# Patient Record
Sex: Female | Born: 1990
Health system: Southern US, Community
[De-identification: ages and names within clinical notes are randomized; demographics above are authoritative.]

## PROBLEM LIST (undated history)

## (undated) DIAGNOSIS — J45909 Unspecified asthma, uncomplicated: Secondary | ICD-10-CM

## (undated) HISTORY — PX: NO PAST SURGERIES: SHX2092

---

## 2018-12-26 NOTE — L&D Delivery Note (Signed)
Delivery Note At 7:04 PM a viable and healthy female was delivered via Vaginal, Spontaneous (Presentation: LOA ).  APGAR: 8, 9; weight pending.   Placenta status: spontaneous,intact .  Cord:  with the following complications: .  Cord pH: na  Anesthesia: epidural Episiotomy: None Lacerations: Vaginal;Periurethral Suture Repair: 2.0 vicryl rapide Est. Blood Loss (mL): 200  Mom to postpartum.  Baby to Couplet care / Skin to Skin.  Juna Caban J 11/03/2019, 8:12 PM

## 2019-03-18 DIAGNOSIS — Z3201 Encounter for pregnancy test, result positive: Secondary | ICD-10-CM | POA: Diagnosis not present

## 2019-04-22 DIAGNOSIS — Z118 Encounter for screening for other infectious and parasitic diseases: Secondary | ICD-10-CM | POA: Diagnosis not present

## 2019-04-22 DIAGNOSIS — Z124 Encounter for screening for malignant neoplasm of cervix: Secondary | ICD-10-CM | POA: Diagnosis not present

## 2019-04-22 DIAGNOSIS — Z3689 Encounter for other specified antenatal screening: Secondary | ICD-10-CM | POA: Diagnosis not present

## 2019-04-22 DIAGNOSIS — Z3682 Encounter for antenatal screening for nuchal translucency: Secondary | ICD-10-CM | POA: Diagnosis not present

## 2019-04-22 DIAGNOSIS — Z3401 Encounter for supervision of normal first pregnancy, first trimester: Secondary | ICD-10-CM | POA: Diagnosis not present

## 2019-04-22 DIAGNOSIS — Z113 Encounter for screening for infections with a predominantly sexual mode of transmission: Secondary | ICD-10-CM | POA: Diagnosis not present

## 2019-04-22 LAB — OB RESULTS CONSOLE RUBELLA ANTIBODY, IGM: Rubella: IMMUNE

## 2019-04-22 LAB — OB RESULTS CONSOLE GC/CHLAMYDIA
Chlamydia: NEGATIVE
Gonorrhea: NEGATIVE

## 2019-04-22 LAB — OB RESULTS CONSOLE HEPATITIS B SURFACE ANTIGEN: Hepatitis B Surface Ag: NEGATIVE

## 2019-04-22 LAB — OB RESULTS CONSOLE HIV ANTIBODY (ROUTINE TESTING): HIV: NONREACTIVE

## 2019-05-08 DIAGNOSIS — Z3401 Encounter for supervision of normal first pregnancy, first trimester: Secondary | ICD-10-CM | POA: Diagnosis not present

## 2019-05-08 DIAGNOSIS — Z3682 Encounter for antenatal screening for nuchal translucency: Secondary | ICD-10-CM | POA: Diagnosis not present

## 2019-06-05 DIAGNOSIS — Z361 Encounter for antenatal screening for raised alphafetoprotein level: Secondary | ICD-10-CM | POA: Diagnosis not present

## 2019-06-05 DIAGNOSIS — Z3402 Encounter for supervision of normal first pregnancy, second trimester: Secondary | ICD-10-CM | POA: Diagnosis not present

## 2019-06-21 DIAGNOSIS — Z3402 Encounter for supervision of normal first pregnancy, second trimester: Secondary | ICD-10-CM | POA: Diagnosis not present

## 2019-06-21 DIAGNOSIS — Z363 Encounter for antenatal screening for malformations: Secondary | ICD-10-CM | POA: Diagnosis not present

## 2019-07-18 DIAGNOSIS — Z3402 Encounter for supervision of normal first pregnancy, second trimester: Secondary | ICD-10-CM | POA: Diagnosis not present

## 2019-08-16 DIAGNOSIS — Z3689 Encounter for other specified antenatal screening: Secondary | ICD-10-CM | POA: Diagnosis not present

## 2019-08-16 DIAGNOSIS — Z3492 Encounter for supervision of normal pregnancy, unspecified, second trimester: Secondary | ICD-10-CM | POA: Diagnosis not present

## 2019-09-04 DIAGNOSIS — Z23 Encounter for immunization: Secondary | ICD-10-CM | POA: Diagnosis not present

## 2019-09-04 DIAGNOSIS — O3663X0 Maternal care for excessive fetal growth, third trimester, not applicable or unspecified: Secondary | ICD-10-CM | POA: Diagnosis not present

## 2019-09-04 DIAGNOSIS — Z3A29 29 weeks gestation of pregnancy: Secondary | ICD-10-CM | POA: Diagnosis not present

## 2019-09-04 DIAGNOSIS — Z3689 Encounter for other specified antenatal screening: Secondary | ICD-10-CM | POA: Diagnosis not present

## 2019-09-04 LAB — OB RESULTS CONSOLE RPR: RPR: NONREACTIVE

## 2019-09-18 DIAGNOSIS — Z3A31 31 weeks gestation of pregnancy: Secondary | ICD-10-CM | POA: Diagnosis not present

## 2019-09-18 DIAGNOSIS — O3663X0 Maternal care for excessive fetal growth, third trimester, not applicable or unspecified: Secondary | ICD-10-CM | POA: Diagnosis not present

## 2019-09-29 DIAGNOSIS — H60319 Diffuse otitis externa, unspecified ear: Secondary | ICD-10-CM | POA: Diagnosis not present

## 2019-09-30 DIAGNOSIS — Z23 Encounter for immunization: Secondary | ICD-10-CM | POA: Diagnosis not present

## 2019-09-30 DIAGNOSIS — Z3A33 33 weeks gestation of pregnancy: Secondary | ICD-10-CM | POA: Diagnosis not present

## 2019-09-30 DIAGNOSIS — O3663X0 Maternal care for excessive fetal growth, third trimester, not applicable or unspecified: Secondary | ICD-10-CM | POA: Diagnosis not present

## 2019-10-14 DIAGNOSIS — Z3A35 35 weeks gestation of pregnancy: Secondary | ICD-10-CM | POA: Diagnosis not present

## 2019-10-14 DIAGNOSIS — O3663X0 Maternal care for excessive fetal growth, third trimester, not applicable or unspecified: Secondary | ICD-10-CM | POA: Diagnosis not present

## 2019-10-14 DIAGNOSIS — Z3685 Encounter for antenatal screening for Streptococcus B: Secondary | ICD-10-CM | POA: Diagnosis not present

## 2019-10-14 LAB — OB RESULTS CONSOLE GBS: GBS: POSITIVE

## 2019-10-21 DIAGNOSIS — O3663X0 Maternal care for excessive fetal growth, third trimester, not applicable or unspecified: Secondary | ICD-10-CM | POA: Diagnosis not present

## 2019-10-21 DIAGNOSIS — Z3A36 36 weeks gestation of pregnancy: Secondary | ICD-10-CM | POA: Diagnosis not present

## 2019-10-28 DIAGNOSIS — O3663X0 Maternal care for excessive fetal growth, third trimester, not applicable or unspecified: Secondary | ICD-10-CM | POA: Diagnosis not present

## 2019-10-28 DIAGNOSIS — Z3A37 37 weeks gestation of pregnancy: Secondary | ICD-10-CM | POA: Diagnosis not present

## 2019-11-03 ENCOUNTER — Inpatient Hospital Stay (HOSPITAL_COMMUNITY): Payer: BC Managed Care – PPO | Admitting: Anesthesiology

## 2019-11-03 ENCOUNTER — Encounter (HOSPITAL_COMMUNITY): Payer: Self-pay

## 2019-11-03 ENCOUNTER — Inpatient Hospital Stay (HOSPITAL_COMMUNITY)
Admission: AD | Admit: 2019-11-03 | Discharge: 2019-11-05 | DRG: 806 | Disposition: A | Discharge status: Home / Self Care | Payer: BC Managed Care – PPO | Attending: Obstetrics and Gynecology | Admitting: Obstetrics and Gynecology

## 2019-11-03 ENCOUNTER — Other Ambulatory Visit: Payer: Self-pay

## 2019-11-03 ENCOUNTER — Inpatient Hospital Stay (HOSPITAL_COMMUNITY): Payer: BC Managed Care – PPO

## 2019-11-03 DIAGNOSIS — O4693 Antepartum hemorrhage, unspecified, third trimester: Secondary | ICD-10-CM

## 2019-11-03 DIAGNOSIS — Z3A38 38 weeks gestation of pregnancy: Secondary | ICD-10-CM | POA: Diagnosis not present

## 2019-11-03 DIAGNOSIS — O9081 Anemia of the puerperium: Secondary | ICD-10-CM | POA: Diagnosis not present

## 2019-11-03 DIAGNOSIS — Z20828 Contact with and (suspected) exposure to other viral communicable diseases: Secondary | ICD-10-CM | POA: Diagnosis not present

## 2019-11-03 DIAGNOSIS — D62 Acute posthemorrhagic anemia: Secondary | ICD-10-CM | POA: Diagnosis not present

## 2019-11-03 DIAGNOSIS — O99344 Other mental disorders complicating childbirth: Secondary | ICD-10-CM | POA: Diagnosis present

## 2019-11-03 DIAGNOSIS — F419 Anxiety disorder, unspecified: Secondary | ICD-10-CM | POA: Diagnosis present

## 2019-11-03 DIAGNOSIS — O9952 Diseases of the respiratory system complicating childbirth: Secondary | ICD-10-CM | POA: Diagnosis not present

## 2019-11-03 DIAGNOSIS — O99824 Streptococcus B carrier state complicating childbirth: Secondary | ICD-10-CM | POA: Diagnosis not present

## 2019-11-03 DIAGNOSIS — J45909 Unspecified asthma, uncomplicated: Secondary | ICD-10-CM | POA: Diagnosis not present

## 2019-11-03 DIAGNOSIS — F429 Obsessive-compulsive disorder, unspecified: Secondary | ICD-10-CM | POA: Diagnosis present

## 2019-11-03 HISTORY — DX: Unspecified asthma, uncomplicated: J45.909

## 2019-11-03 LAB — CBC
HCT: 41.4 % (ref 36.0–46.0)
Hemoglobin: 13.4 g/dL (ref 12.0–15.0)
MCH: 29.5 pg (ref 26.0–34.0)
MCHC: 32.4 g/dL (ref 30.0–36.0)
MCV: 91.2 fL (ref 80.0–100.0)
Platelets: 257 10*3/uL (ref 150–400)
RBC: 4.54 MIL/uL (ref 3.87–5.11)
RDW: 13.6 % (ref 11.5–15.5)
WBC: 10.6 10*3/uL — ABNORMAL HIGH (ref 4.0–10.5)
nRBC: 0 % (ref 0.0–0.2)

## 2019-11-03 LAB — TYPE AND SCREEN
ABO/RH(D): O POS
Antibody Screen: NEGATIVE

## 2019-11-03 LAB — SARS CORONAVIRUS 2 (TAT 6-24 HRS): SARS Coronavirus 2: NEGATIVE

## 2019-11-03 LAB — DIC (DISSEMINATED INTRAVASCULAR COAGULATION)PANEL
D-Dimer, Quant: 1.1 ug/mL-FEU — ABNORMAL HIGH (ref 0.00–0.50)
Fibrinogen: 532 mg/dL — ABNORMAL HIGH (ref 210–475)
INR: 1 (ref 0.8–1.2)
Platelets: 260 10*3/uL (ref 150–400)
Prothrombin Time: 12.6 seconds (ref 11.4–15.2)
Smear Review: NONE SEEN
aPTT: 28 seconds (ref 24–36)

## 2019-11-03 LAB — RPR: RPR Ser Ql: NONREACTIVE

## 2019-11-03 LAB — ABO/RH: ABO/RH(D): O POS

## 2019-11-03 MED ORDER — LIDOCAINE HCL (PF) 1 % IJ SOLN: 30.0000 mL | INTRAMUSCULAR | Status: DC | PRN

## 2019-11-03 MED ORDER — LIDOCAINE HCL (PF) 1 % IJ SOLN
INTRAMUSCULAR | Status: DC | PRN
  Administered 2019-11-03: 10 mL via EPIDURAL

## 2019-11-03 MED ORDER — LACTATED RINGERS IV SOLN
INTRAVENOUS | Status: DC
  Administered 2019-11-03: 09:00:00 via INTRAVENOUS

## 2019-11-03 MED ORDER — SODIUM CHLORIDE 0.9 % IV SOLN
5.0000 10*6.[IU] | Freq: Once | INTRAVENOUS | Status: AC
  Administered 2019-11-03: 5 10*6.[IU] via INTRAVENOUS
  Filled 2019-11-03: qty 5

## 2019-11-03 MED ORDER — ONDANSETRON HCL 4 MG/2ML IJ SOLN: 4.0000 mg | INTRAMUSCULAR | Status: DC | PRN

## 2019-11-03 MED ORDER — EPHEDRINE 5 MG/ML INJ: 10.0000 mg | INTRAVENOUS | Status: DC | PRN

## 2019-11-03 MED ORDER — LACTATED RINGERS IV SOLN: 500.0000 mL | INTRAVENOUS | Status: DC | PRN

## 2019-11-03 MED ORDER — PRENATAL MULTIVITAMIN CH
1.0000 | ORAL_TABLET | Freq: Every day | ORAL | Status: DC
  Administered 2019-11-04: 1 via ORAL
  Filled 2019-11-03: qty 1

## 2019-11-03 MED ORDER — OXYCODONE-ACETAMINOPHEN 5-325 MG PO TABS: 2.0000 | ORAL_TABLET | ORAL | Status: DC | PRN

## 2019-11-03 MED ORDER — COCONUT OIL OIL: 1.0000 "application " | TOPICAL_OIL | Status: DC | PRN

## 2019-11-03 MED ORDER — FENTANYL-BUPIVACAINE-NACL 0.5-0.125-0.9 MG/250ML-% EP SOLN
EPIDURAL | Status: AC
  Filled 2019-11-03: qty 250

## 2019-11-03 MED ORDER — ONDANSETRON HCL 4 MG PO TABS: 4.0000 mg | ORAL_TABLET | ORAL | Status: DC | PRN

## 2019-11-03 MED ORDER — OXYTOCIN 10 UNIT/ML IJ SOLN: 10.0000 [IU] | Freq: Once | INTRAMUSCULAR | Status: DC

## 2019-11-03 MED ORDER — IBUPROFEN 600 MG PO TABS
600.0000 mg | ORAL_TABLET | Freq: Four times a day (QID) | ORAL | Status: DC
  Administered 2019-11-03 – 2019-11-05 (×6): 600 mg via ORAL
  Filled 2019-11-03 (×6): qty 1

## 2019-11-03 MED ORDER — WITCH HAZEL-GLYCERIN EX PADS: 1.0000 "application " | MEDICATED_PAD | CUTANEOUS | Status: DC | PRN

## 2019-11-03 MED ORDER — PENICILLIN G POT IN DEXTROSE 60000 UNIT/ML IV SOLN
3.0000 10*6.[IU] | INTRAVENOUS | Status: DC
  Administered 2019-11-03 (×2): 3 10*6.[IU] via INTRAVENOUS
  Filled 2019-11-03 (×2): qty 50

## 2019-11-03 MED ORDER — ACETAMINOPHEN 325 MG PO TABS: 650.0000 mg | ORAL_TABLET | ORAL | Status: DC | PRN

## 2019-11-03 MED ORDER — OXYCODONE-ACETAMINOPHEN 5-325 MG PO TABS: 1.0000 | ORAL_TABLET | ORAL | Status: DC | PRN

## 2019-11-03 MED ORDER — FENTANYL-BUPIVACAINE-NACL 0.5-0.125-0.9 MG/250ML-% EP SOLN: 12.0000 mL/h | EPIDURAL | Status: DC | PRN

## 2019-11-03 MED ORDER — SODIUM CHLORIDE (PF) 0.9 % IJ SOLN
INTRAMUSCULAR | Status: DC | PRN
  Administered 2019-11-03: 12 mL/h via EPIDURAL

## 2019-11-03 MED ORDER — DIBUCAINE (PERIANAL) 1 % EX OINT: 1.0000 "application " | TOPICAL_OINTMENT | CUTANEOUS | Status: DC | PRN

## 2019-11-03 MED ORDER — ONDANSETRON HCL 4 MG/2ML IJ SOLN: 4.0000 mg | Freq: Four times a day (QID) | INTRAMUSCULAR | Status: DC | PRN

## 2019-11-03 MED ORDER — PHENYLEPHRINE 40 MCG/ML (10ML) SYRINGE FOR IV PUSH (FOR BLOOD PRESSURE SUPPORT): 80.0000 ug | PREFILLED_SYRINGE | INTRAVENOUS | Status: DC | PRN

## 2019-11-03 MED ORDER — LACTATED RINGERS IV SOLN
500.0000 mL | Freq: Once | INTRAVENOUS | Status: AC
  Administered 2019-11-03: 500 mL via INTRAVENOUS

## 2019-11-03 MED ORDER — DIPHENHYDRAMINE HCL 50 MG/ML IJ SOLN: 12.5000 mg | INTRAMUSCULAR | Status: DC | PRN

## 2019-11-03 MED ORDER — FENTANYL CITRATE (PF) 100 MCG/2ML IJ SOLN
100.0000 ug | INTRAMUSCULAR | Status: DC | PRN
  Administered 2019-11-03: 100 ug via INTRAVENOUS
  Filled 2019-11-03: qty 2

## 2019-11-03 MED ORDER — SIMETHICONE 80 MG PO CHEW: 80.0000 mg | CHEWABLE_TABLET | ORAL | Status: DC | PRN

## 2019-11-03 MED ORDER — OXYTOCIN BOLUS FROM INFUSION
500.0000 mL | Freq: Once | INTRAVENOUS | Status: AC
  Administered 2019-11-03: 500 mL via INTRAVENOUS

## 2019-11-03 MED ORDER — METHYLERGONOVINE MALEATE 0.2 MG/ML IJ SOLN: 0.2000 mg | INTRAMUSCULAR | Status: DC | PRN

## 2019-11-03 MED ORDER — ZOLPIDEM TARTRATE 5 MG PO TABS: 5.0000 mg | ORAL_TABLET | Freq: Every evening | ORAL | Status: DC | PRN

## 2019-11-03 MED ORDER — SOD CITRATE-CITRIC ACID 500-334 MG/5ML PO SOLN: 30.0000 mL | ORAL | Status: DC | PRN

## 2019-11-03 MED ORDER — SENNOSIDES-DOCUSATE SODIUM 8.6-50 MG PO TABS
2.0000 | ORAL_TABLET | ORAL | Status: DC
  Administered 2019-11-03 – 2019-11-04 (×2): 2 via ORAL
  Filled 2019-11-03 (×2): qty 2

## 2019-11-03 MED ORDER — TERBUTALINE SULFATE 1 MG/ML IJ SOLN: 0.2500 mg | Freq: Once | INTRAMUSCULAR | Status: DC | PRN

## 2019-11-03 MED ORDER — OXYTOCIN 40 UNITS IN NORMAL SALINE INFUSION - SIMPLE MED
2.5000 [IU]/h | INTRAVENOUS | Status: DC
  Filled 2019-11-03: qty 1000

## 2019-11-03 MED ORDER — BENZOCAINE-MENTHOL 20-0.5 % EX AERO
1.0000 "application " | INHALATION_SPRAY | CUTANEOUS | Status: DC | PRN
  Filled 2019-11-03: qty 56

## 2019-11-03 MED ORDER — OXYTOCIN 40 UNITS IN NORMAL SALINE INFUSION - SIMPLE MED
1.0000 m[IU]/min | INTRAVENOUS | Status: DC
  Administered 2019-11-03: 2 m[IU]/min via INTRAVENOUS

## 2019-11-03 MED ORDER — DIPHENHYDRAMINE HCL 25 MG PO CAPS: 25.0000 mg | ORAL_CAPSULE | Freq: Four times a day (QID) | ORAL | Status: DC | PRN

## 2019-11-03 MED ORDER — TETANUS-DIPHTH-ACELL PERTUSSIS 5-2.5-18.5 LF-MCG/0.5 IM SUSP: 0.5000 mL | Freq: Once | INTRAMUSCULAR | Status: DC

## 2019-11-03 MED ORDER — METHYLERGONOVINE MALEATE 0.2 MG PO TABS: 0.2000 mg | ORAL_TABLET | ORAL | Status: DC | PRN

## 2019-11-03 NOTE — Anesthesia Preprocedure Evaluation (Signed)
Anesthesia Evaluation  Patient identified by MRN, date of birth, ID band Patient awake    Reviewed: Allergy & Precautions, H&P , NPO status , Patient's Chart, lab work & pertinent test results  History of Anesthesia Complications Negative for: history of anesthetic complications  Airway Mallampati: II  TM Distance: >3 FB Neck ROM: full    Dental no notable dental hx.    Pulmonary asthma ,    Pulmonary exam normal        Cardiovascular negative cardio ROS Normal cardiovascular exam Rhythm:regular Rate:Normal     Neuro/Psych negative neurological ROS  negative psych ROS   GI/Hepatic negative GI ROS, Neg liver ROS,   Endo/Other  negative endocrine ROS  Renal/GU negative Renal ROS  negative genitourinary   Musculoskeletal   Abdominal   Peds  Hematology negative hematology ROS (+)   Anesthesia Other Findings   Reproductive/Obstetrics (+) Pregnancy                             Anesthesia Physical Anesthesia Plan  ASA: II  Anesthesia Plan: Epidural   Post-op Pain Management:    Induction:   PONV Risk Score and Plan:   Airway Management Planned:   Additional Equipment:   Intra-op Plan:   Post-operative Plan:   Informed Consent: I have reviewed the patients History and Physical, chart, labs and discussed the procedure including the risks, benefits and alternatives for the proposed anesthesia with the patient or authorized representative who has indicated his/her understanding and acceptance.       Plan Discussed with:   Anesthesia Plan Comments:         Anesthesia Quick Evaluation  

## 2019-11-03 NOTE — Progress Notes (Signed)
S; notes ctx  O: BP 137/87   Pulse 92   Temp 98.6 F (37 C)   Resp 16   Ht 5\' 6"  (1.676 m)   Wt 84.9 kg   SpO2 100%   BMI 30.21 kg/m  Pitocin 4 miu  VE 3/80/-3 ROP  Tracing baseline 130 (+) accels 150 Ctx q 2-4 mins   IMP: latent phase SROM GBS cx (+) on IV PCN P) right exaggerated sims. Cont pitocin. Cont IV PCN

## 2019-11-03 NOTE — MAU Provider Note (Addendum)
Patient Rachel Huff is a 28 y.o. G1P0 At [redacted]w[redacted]d here with complaints of SROM and bleeding. She denies decreased fetal movements, nausea, vomiting, HA or other ob-gyn complaints. She gets her care at [facility]. She has had an uncomplicated pregnancy.   Patient says she was 1 cm at her OB appt on Monday; she started having contractions that feel like strong cramps on Friday and then had a gush of fluid this morning at 5:30 am.   History     CSN: [id number]  Arrival date and time: 11/03/19 0640   None     Chief Complaint  Patient presents with  . Contractions  . Vaginal Bleeding   Vaginal Bleeding The patient's primary symptoms include vaginal bleeding. This is a new problem. The current episode started today. The problem occurs intermittently. The problem has been unchanged. The pain is moderate. She is pregnant. Pertinent negatives include no abdominal pain, constipation, diarrhea, fever, hematuria, urgency or vomiting. The vaginal discharge was bloody. The vaginal bleeding is typical of menses. She has not been passing clots. She has not been passing tissue.    OB History    Gravida  1   Para      Term      Preterm      AB      Living        SAB      TAB      Ectopic      Multiple      Live Births              Past Medical History:  Diagnosis Date  . Asthma    as a child    Past Surgical History:  Procedure Laterality Date  . NO PAST SURGERIES      History reviewed. No pertinent family history.  Social History   Tobacco Use  . Smoking status: Never Smoker  . Smokeless tobacco: Never Used  Substance Use Topics  . Alcohol use: Not Currently    Frequency: Never  . Drug use: Never    Allergies: No Known Allergies  No medications prior to admission.    Review of Systems  Constitutional: Negative.  Negative for fever.  HENT: Negative.   Respiratory: Negative.   Cardiovascular: Negative.   Gastrointestinal: Negative.  Negative for  abdominal pain, constipation, diarrhea and vomiting.  Genitourinary: Positive for vaginal bleeding. Negative for hematuria and urgency.  Musculoskeletal: Negative.   Skin: Negative.   Neurological: Negative.    Physical Exam   Blood pressure 138/85, pulse (!) 107, temperature 98 F (36.7 C), temperature source Oral, resp. rate 16, height 5\' 6"  (1.676 m), weight 84.9 kg, SpO2 100 %.  Physical Exam  Constitutional: She is oriented to person, place, and time. She appears well-developed and well-nourished.  HENT:  Head: Normocephalic.  Neck: Normal range of motion.  Respiratory: Effort normal.  GI: Soft.  Genitourinary:    Genitourinary Comments: Bright red watery blood in the vagina, no clots, unable to visualize cervix but feels 1.5 cm, cephalic. Questionable amniotic sac   Musculoskeletal: Normal range of motion.  Neurological: She is alert and oriented to person, place, and time.  Skin: Skin is warm and dry.    MAU Course  Procedures  MDM -[place] : 135 bpm, mod var, present acel, no decels, ctx q 1-3 min -two fern slides unable to be read because of bright red bleeding, amnisure not done.   -Given that patient has frank bright red bleeding, will  order [place] and notify Dr. Cherly Hensen.   1497: Dr. Cherly Hensen paged.   0825: Spoke with Dr. Cherly Hensen and recommended admission; Dr. Cherly Hensen agrees and assumes care of this patient. She asks for RN to call when [place] report is back.  0263: Dr. Cherly Hensen at the bedside to assess patient.   Assessment and Plan    Charlesetta Garibaldi [facility] 11/03/2019, 7:54 AM

## 2019-11-03 NOTE — MAU Note (Addendum)
Patient reports to MAU c/o ctx and gush of watery blood around 0530 with a blood clot about the size silver dollar and 2nd clot size of nickel. Patient reports calling her on call provider and was told to come in. Patient reports +FM.

## 2019-11-03 NOTE — H&P (Signed)
Rachel Huff is a 28 y.o. female presenting @ 44 wk 2/7 gestation with c/o vaginal bleeding with fluid. (+) GBS cx  OB History    Gravida  1   Para      Term      Preterm      AB      Living        SAB      TAB      Ectopic      Multiple      Live Births             Past Medical History:  Diagnosis Date  . Asthma    as a child   Past Surgical History:  Procedure Laterality Date  . NO PAST SURGERIES     Family History: family history is not on file. Social History:  reports that she has never smoked. She has never used smokeless tobacco. She reports previous alcohol use. She reports that she does not use drugs.     Maternal Diabetes: No Genetic Screening: Normal Maternal Ultrasounds/Referrals: Normal Fetal Ultrasounds or other Referrals:  None Maternal Substance Abuse:  No Significant Maternal Medications:  Meds include: Other: effexor Significant Maternal Lab Results:  Group B Strep positive Other Comments:  OCD/depression/anxiety  Review of Systems  All other systems reviewed and are negative.  History   Blood pressure 138/85, pulse (!) 107, temperature 98 F (36.7 C), temperature source Oral, resp. rate 16, height 5\' 6"  (1.676 m), weight 84.9 kg, SpO2 100 %. Exam Physical Exam  Constitutional: She is oriented to person, place, and time. She appears well-developed and well-nourished.  HENT:  Head: Atraumatic.  Eyes: EOM are normal.  Neck: Neck supple.  Cardiovascular: Regular rhythm.  Respiratory: Breath sounds normal.  GI: Soft.  Musculoskeletal:        General: No edema.  Neurological: She is alert and oriented to person, place, and time.  Skin: Skin is warm and dry.  Psychiatric: She has a normal mood and affect.    Prenatal labs: ABO, Rh:  O positive Antibody:   neg Rubella:  immune RPR:   NR HBsAg:   neg HIV:   neg GBS:   positive Tracing: baseline 140 (+) accel 160 irreg ctx  U/s: nl fluid vtx. No placenta previa or  abruption Assessment/Plan: SROM 3rd trimester vaginal bleeding.  IUP@ 38 2/7 P) admit routine labs. DIC panel. Intracervical balloon.. pitocin augmentation. IV PCN  Analgesic prn   Rachel Huff 11/03/2019, 8:42 AM

## 2019-11-03 NOTE — Anesthesia Procedure Notes (Signed)
Epidural Patient location during procedure: OB Start time: 11/03/2019 4:13 PM End time: 11/03/2019 4:23 PM  Staffing Anesthesiologist: Lidia Collum, MD Performed: anesthesiologist   Preanesthetic Checklist Completed: patient identified, pre-op evaluation, timeout performed, IV checked, risks and benefits discussed and monitors and equipment checked  Epidural Patient position: sitting Prep: DuraPrep Patient monitoring: heart rate, continuous pulse ox and blood pressure Approach: midline Location: L3-L4 Injection technique: LOR air  Needle:  Needle type: Tuohy  Needle gauge: 17 G Needle length: 9 cm Needle insertion depth: 4 cm Catheter type: closed end flexible Catheter size: 19 Gauge Catheter at skin depth: 9 cm Test dose: negative  Assessment Events: blood not aspirated, injection not painful, no injection resistance, negative IV test and no paresthesia  Additional Notes Reason for block:procedure for pain

## 2019-11-04 LAB — CBC
HCT: 35.9 % — ABNORMAL LOW (ref 36.0–46.0)
Hemoglobin: 11.8 g/dL — ABNORMAL LOW (ref 12.0–15.0)
MCH: 29.6 pg (ref 26.0–34.0)
MCHC: 32.9 g/dL (ref 30.0–36.0)
MCV: 90 fL (ref 80.0–100.0)
Platelets: 226 10*3/uL (ref 150–400)
RBC: 3.99 MIL/uL (ref 3.87–5.11)
RDW: 13.5 % (ref 11.5–15.5)
WBC: 11.6 10*3/uL — ABNORMAL HIGH (ref 4.0–10.5)
nRBC: 0 % (ref 0.0–0.2)

## 2019-11-04 MED ORDER — VENLAFAXINE HCL ER 75 MG PO CP24
225.0000 mg | ORAL_CAPSULE | ORAL | Status: AC
  Administered 2019-11-04: 225 mg via ORAL
  Filled 2019-11-04: qty 1

## 2019-11-04 NOTE — Anesthesia Postprocedure Evaluation (Signed)
Anesthesia Post Note  Patient: Rachel Huff  Procedure(s) Performed: AN AD HOC LABOR EPIDURAL     Patient location during evaluation: Mother Baby Anesthesia Type: Epidural Level of consciousness: awake and alert Pain management: pain level controlled Vital Signs Assessment: post-procedure vital signs reviewed and stable Respiratory status: spontaneous breathing, nonlabored ventilation and respiratory function stable Cardiovascular status: stable Postop Assessment: no headache, no backache and epidural receding Anesthetic complications: no    Last Vitals:  Vitals:   11/04/19 0200 11/04/19 0601  BP: 132/79 129/80  Pulse: 75 79  Resp: 18 18  Temp:  (!) 36.4 C  SpO2:      Last Pain:  Vitals:   11/04/19 0601  TempSrc: Oral  PainSc: 0-No pain   Pain Goal:                   Robbert Langlinais

## 2019-11-04 NOTE — Progress Notes (Signed)
[name] was referred for history of depression/anxiety.  * Referral screened out by Clinical Social Worker because none of the following criteria appear to apply:  ~ History of anxiety/depression during this pregnancy, or of post-partum depression following prior delivery. ~ Diagnosis of anxiety and/or depression within last 3 years OR * [name]'s symptoms currently being treated with medication and/or therapy. [name] currently taking Effexor. No concerns noted in [facility] records.   Please contact the Clinical Social Worker if needs arise, by [facility] request, or if [name] scores greater than 9/yes to question 10 on [place] Postpartum Depression Screen.  Rachel Huff, [place]  [facility] [employer] [phone]

## 2019-11-04 NOTE — Lactation Note (Signed)
This note was copied from a baby's chart. Lactation Consultation Note  Patient Name: Rachel Huff [id number] Date: 11/04/2019 Reason for consult: Initial assessment;Early term 37-38.6wks;[place] Baby is 23 hours old.  He has been sleepy and no latched achieved yet.  Mom has been hand expressing and giving a ml at a time.  She just pumped 10 mls of colostrum.  Mom states she has been leaking large amounts of colostrum.  Attempted to cup feed colostrum but baby not interested.  Finger fed using curved tip syringe and baby took 10 mls and tolerated well.  Mom is relieved baby has been fed.  Plan is to put to breast with cues or at least every 2-3 hours, call for assist prn, if unable to latch pump breasts and finger feed colostrum.  Maternal Data    Feeding Feeding Type: Breast Milk  LATCH Score Latch: Too sleepy or reluctant, no latch achieved, no sucking elicited.  Audible Swallowing: None  Type of Nipple: Everted at rest and after stimulation  Comfort (Breast/Nipple): Soft / non-tender  Hold (Positioning): Assistance needed to correctly position infant at breast and maintain latch.  LATCH Score: 5  Interventions    Lactation Tools Discussed/Used Pump Review: Setup, frequency, and cleaning;Milk Storage Initiated by:: LBJ Date initiated:: 11/04/19   Consult Status Consult Status: Follow-up Date: 11/05/19 Follow-up type: In-patient    Ave Filter 11/04/2019, 12:51 PM

## 2019-11-04 NOTE — Progress Notes (Signed)
PPD 1 SVD with vaginal and periurethral repair  Subjective:   reports feeling ok - sore and crampy tolerating po intake without nausea or vomiting vaginal bleeding reported as moderate pain mostly controlled  up ad lib / voiding QS without urinary concerns  Newborn Breast / Circumcision planned  Objective:   VS: BP 129/80 (BP Location: Right Arm)   Pulse 79   Temp (!) 97.5 F (36.4 C) (Oral)   Resp 18   Ht 5\' 6"  (1.676 m)   Wt 84.9 kg   SpO2 97%   Breastfeeding Unknown   BMI 30.21 kg/m   LABS:  Recent Labs    11/03/19 0920 11/04/19 0458  WBC 10.6* 11.6*  HGB 13.4 11.8*  PLT 257 226   Blood type: --/--/O POS (11/08 4163) Rubella: Immune (04/27 0000)       flu and tdap current 2020  Physical Exam: alert and oriented X3 without any distress or pain abdomen soft, non-tender, non-distended  uterine fundus firm, non-tender, Ueven perineum ice pack in place / moderate edema lochia moderate extremities: no edema, no calf pain or tenderness   Assessment / Plan: PPD # 1 SVD with vaginal and periurethral repair     doing well - stable status    routine post partum orders  Anticipate DC tomorrow in AM Newborn circ prior to Kim, MSN, [facility] 11/04/2019, 9:00 AM

## 2019-11-05 MED ORDER — IBUPROFEN 600 MG PO TABS: 600.0000 mg | ORAL_TABLET | Freq: Four times a day (QID) | ORAL | 0 refills | Status: AC

## 2019-11-05 NOTE — Lactation Note (Signed)
This note was copied from a baby's chart. Lactation Consultation Note  Patient Name: Rachel Huff Date: 11/05/2019  Baby is 37 hours old/6% weight loss.  Baby has been latching very briefly.  Mom has been pumping with manual pump every 3 hours and obtaining 10-15 mls.  Colostrum has been finger fed to baby.  Mom states baby started cluster feeding this morning.  Assisted with positioning baby in cross cradle hold but then switched to football hold.  Baby showing good feeding cues.  Baby unable to grasp breast tissue so a 20 mm nipple shield applied.  Baby latched easily and fed off and on for 15 minutes.  Some dimpling noted in cheeks.  Colostrum seen in shield.  Baby continued to show feeding cues.  Assisted mom with pumping with symphony pump.  Good flow of milk.  Mom will feed baby expressed milk using a slow flow nipple.  She has Dr. Owens Shark nipples at home.  She has a Spectra pump for home use.  Discussed milk coming to volume and the prevention and treatment of engorgement.  Outpatient services discussed and encouraged.  Plan is to put baby to breast with cues using nipple shield, post pump and give baby expressed milk by bottle.   Maternal Data    Feeding Feeding Type: Breast Milk  LATCH Score Latch: Too sleepy or reluctant, no latch achieved, no sucking elicited.  Audible Swallowing: None  Type of Nipple: Everted at rest and after stimulation  Comfort (Breast/Nipple): Soft / non-tender  Hold (Positioning): No assistance needed to correctly position infant at breast.  LATCH Score: 6  Interventions    Lactation Tools Discussed/Used     Consult Status      Ave Filter 11/05/2019, 8:56 AM

## 2019-11-05 NOTE — Progress Notes (Signed)
PPD #1, SVD, vaginal and periurethral repair, baby boy  S:  Reports feeling well; ready to go home today  Denies HA, visual changes, RUQ/epigastric pain              Tolerating po/ No nausea or vomiting / Denies dizziness or SOB             Bleeding is light             Pain controlled with Motrin             Up ad lib / ambulatory / voiding QS  Newborn breast feeding - producing a lot of colostrum and bottle feeding / Circumcision - declined  O:               VS: BP 129/83 (BP Location: Left Arm)   Pulse 81   Temp 98.5 F (36.9 C) (Oral)   Resp 18   Ht 5\' 6"  (1.676 m)   Wt 84.9 kg   SpO2 99%   Breastfeeding Unknown   BMI 30.21 kg/m  11/05/19 0927  -  81  -  -  129/83  High-fowlers  -  -  -  -  - LJ   11/05/19 0926  -  82  -  -  128/92Abnormal   High-fowlers  -  -  -  -  - LJ   11/05/19 0551  98.5 F (36.9 C)  73  -  18  136/91Abnormal   Semi-fowlers  99 %  -  -  -  - BA   11/04/19 2253  98.3 F (36.8 C)  91  -  18  136/83  Sitting  -  -  -  -  - TH   11/04/19 1500  98.1 F (36.7 C)  86  -  -  139/81  High-fowlers  -  -  -  -  - CD   11/04/19 1123  97.7 F (36.5 C)  84  -  -  130/79  High-fowlers  -  -  -  -  - LJ   11/04/19 0601  97.5 F (36.4 C)Abnormal   79  -  18  129/80  Semi-fowlers  -  -  -  -  - KP   11/04/19 0200  -  75  -  18  132/79  Semi-fowlers                 LABS:              Recent Labs    11/03/19 0920 11/04/19 0458  WBC 10.6* 11.6*  HGB 13.4 11.8*  PLT 257 226               Blood type: --/--/O POS (11/08 0924)  Rubella: Immune (04/27 0000)                     I&O: Intake/Output      11/09 0701 - 11/10 0700 11/10 0701 - 11/11 0700   I.V. (mL/kg) 144.2 (1.7)    Total Intake(mL/kg) 144.2 (1.7)    Urine (mL/kg/hr)     Blood     Total Output     Net +144.2                       Physical Exam:             Alert and oriented X3  Lungs: Clear and unlabored  Heart: regular rate and rhythm / no murmurs  Abdomen: soft, non-tender, non-distended               Fundus: firm, non-tender, U-2  Perineum: healing well, mild edema, no erythema or ecchymosis   Lochia: small rubra on pad   Extremities: trace LE edema, no calf pain or tenderness    A: PPD # 2, SVD  Mild ABL Anemia - stable, asymptomatic   Transient elevated BP - asymptomatic   OCD/anxiety - stable on Effexor   Doing well - stable status  P: Routine post partum orders  Discharge home today  WOB discharge book given, instructions and warning s/s reviewed   F/u in 1 week for BP check   Carlean Jews, MSN, CNM [facility]

## 2019-11-05 NOTE — Lactation Note (Signed)
This note was copied from a baby's chart. Lactation Consultation Note Attempted to see mom. Everyone in rm. Sleeping. Spoke w/RN. RN states baby is suckling on finger better but not the breast. Encouraged RN to ask mom to call for assistance. LC suggest baby not to be discharged home today. Needs Lactation assistance and baby BF on the breast.  Patient Name: Rachel Huff Date: 11/05/2019     Maternal Data    Feeding    LATCH Score                   Interventions    Lactation Tools Discussed/Used     Consult Status      Theodoro Kalata 11/05/2019, 4:19 AM

## 2019-11-05 NOTE — Discharge Summary (Signed)
Obstetric Discharge Summary   Patient Name: Rachel Huff DOB: 1991/08/05 MRN: [id number]  Date of Admission: 11/03/2019 Date of Discharge: 11/05/2019 Date of Delivery: 11/03/2019 Gestational Age at Delivery: [redacted]w[redacted]d  Primary OB: Rachel Huff - Dr. Pamala Hurry   Antepartum complications:  - OCD/anxiey/depression on Effexor - GBS positive  Prenatal Labs:  ABO, Rh:  O positive Antibody:   neg Rubella:  immune RPR:   NR HBsAg:   neg HIV:   neg GBS:   positive Tracing: baseline 140 (+) accel 160 irreg ctx Admitting Diagnosis: SROM at 38+2 weeks   Secondary Diagnoses: Patient Active Problem List   Diagnosis Date Noted  . SVD (spontaneous vaginal delivery) 11/04/2019  . Postpartum care following vaginal delivery (11/8) 11/04/2019  . Indication for care in labor or delivery 11/03/2019    Augmentation: Pitocin, Intracervical balloon Complications: None  Date of Delivery: 11/03/2019 Delivered By: Dr. Ronita Hipps Delivery Type: spontaneous vaginal delivery Anesthesia: epidural Placenta: sponatneous Laceration: vaginal, periurethral  Episiotomy: none  Newborn Data: Live born female  Birth Weight: 6 lb 13.5 oz (3104 g) APGAR: 8, 9  Newborn Delivery   Birth date/time: 11/03/2019 19:04:00 Delivery type: Vaginal, Spontaneous      Hospital/Postpartum Course  (Vaginal Delivery): Pt. Admitted with SROM at 38+2 weeks.  She progressed with intracervical balloon and Pitocin.  She received PCN for GBS prophylaxis.  See notes and delivery summary for details. Patient had an uncomplicated postpartum course.  By time of discharge on PPD#2, her pain was controlled on oral pain medications; she had appropriate lochia and was ambulating, voiding without difficulty and tolerating regular diet.  She was deemed stable for discharge to home.     Labs: CBC Latest Ref Rng & Units 11/04/2019 11/03/2019 11/03/2019  WBC 4.0 - 10.5 K/uL 11.6(H) 10.6(H) -  Hemoglobin 12.0 - 15.0 g/dL 11.8(L) 13.4 -   Hematocrit 36.0 - 46.0 % 35.9(L) 41.4 -  Platelets 150 - 400 K/uL 226 257 260   O POS  Physical exam:  BP 129/83 (BP Location: Left Arm)   Pulse 81   Temp 98.5 F (36.9 C) (Oral)   Resp 18   Ht 5\' 6"  (1.676 m)   Wt 84.9 kg   SpO2 99%   Breastfeeding Unknown   BMI 30.21 kg/m  Alert and oriented X3  Lungs: Clear and unlabored  Heart: regular rate and rhythm / no murmurs  Abdomen: soft, non-tender, non-distended              Fundus: firm, non-tender, U-2  Perineum: healing well, mild edema, no erythema or ecchymosis   Lochia: small rubra on pad   Extremities: trace LE edema, no calf pain or tenderness  Disposition: stable, discharge to home Baby Feeding: breast milk Baby Disposition: home with mom  Rh Immune globulin given: n/a Rubella vaccine given: n/a Tdap vaccine given in AP or PP setting: UTD Flu vaccine given in AP or PP setting: UTD   Plan:  Rachel Huff was discharged to home in good condition. Follow-up appointment at [facility] in 1 week.  Discharge Instructions: Per After Visit Summary. Refer to After Visit Summary and [facility] discharge booklet  Activity: Advance as tolerated. Pelvic rest for 6 weeks.   Diet: Regular, Heart Healthy Discharge Medications: Allergies as of 11/05/2019   No Known Allergies     Medication List    TAKE these medications   ibuprofen 600 MG tablet Commonly known as: ADVIL Take 1 tablet (600 mg total) by mouth every 6 (six)  hours.   levocetirizine 5 MG tablet Commonly known as: XYZAL Take 5 mg by mouth every evening.   prenatal multivitamin Tabs tablet Take 1 tablet by mouth daily with breakfast.   venlafaxine XR 75 MG 24 hr capsule Commonly known as: EFFEXOR-XR Take 225 mg by mouth daily with breakfast.            Discharge Care Instructions  (From admission, onward)         Start     Ordered   11/05/19 0000  Discharge wound care:    Comments: Warm water sitz baths as needed for repair    11/05/19 0949         Outpatient follow up:  Follow-up Information    Noland Fordyce, MD. Schedule an appointment as soon as possible for a visit in 1 week(s).   Specialty: Obstetrics and Gynecology Why: Blood pressure check, then 6 week PP visit Contact information: [address] [place] [address] [phone]           Signed:  Carlean Jews, MSN, CNM [facility]

## 2019-11-11 DIAGNOSIS — O135 Gestational [pregnancy-induced] hypertension without significant proteinuria, complicating the puerperium: Secondary | ICD-10-CM | POA: Diagnosis not present

## 2019-11-14 ENCOUNTER — Inpatient Hospital Stay (HOSPITAL_COMMUNITY)
Admission: AD | Admit: 2019-11-14 | Payer: BC Managed Care – PPO | Source: Home / Self Care | Admitting: Obstetrics & Gynecology

## 2020-05-08 DIAGNOSIS — M25532 Pain in left wrist: Secondary | ICD-10-CM | POA: Diagnosis not present

## 2020-05-08 DIAGNOSIS — M654 Radial styloid tenosynovitis [de Quervain]: Secondary | ICD-10-CM | POA: Diagnosis not present

## 2020-06-10 DIAGNOSIS — Z01419 Encounter for gynecological examination (general) (routine) without abnormal findings: Secondary | ICD-10-CM | POA: Diagnosis not present

## 2020-06-10 DIAGNOSIS — F419 Anxiety disorder, unspecified: Secondary | ICD-10-CM | POA: Diagnosis not present

## 2020-06-10 DIAGNOSIS — F321 Major depressive disorder, single episode, moderate: Secondary | ICD-10-CM | POA: Diagnosis not present

## 2020-06-10 DIAGNOSIS — Z6827 Body mass index (BMI) 27.0-27.9, adult: Secondary | ICD-10-CM | POA: Diagnosis not present

## 2021-06-05 IMAGING — US US MFM OB LIMITED
1 series · 14 of 28 positions shown · non-contrast
Comparison: none

[Series 1: us mfm ob limited · 39 acquisitions, 14 frames shown]
[im 2/39]
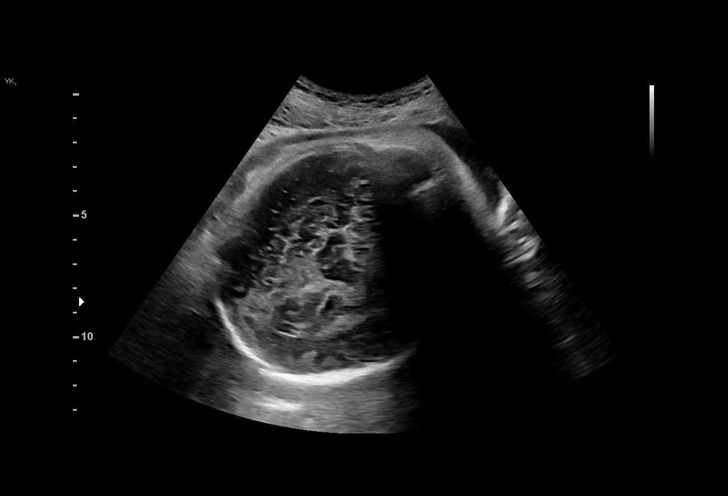
[im 5/39]
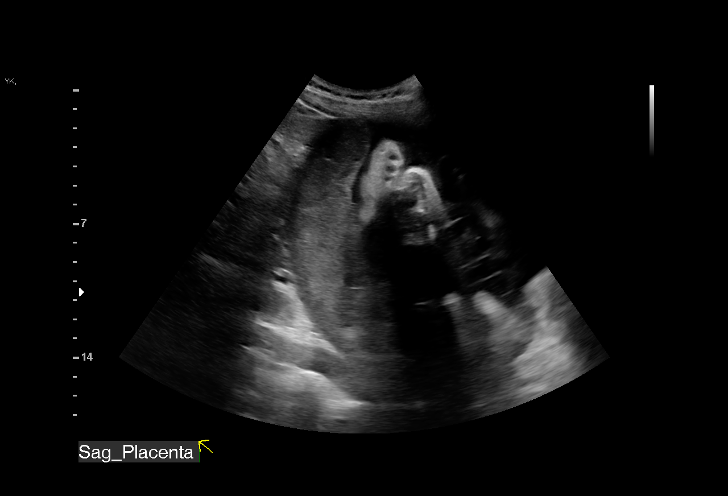
[im 8/39]
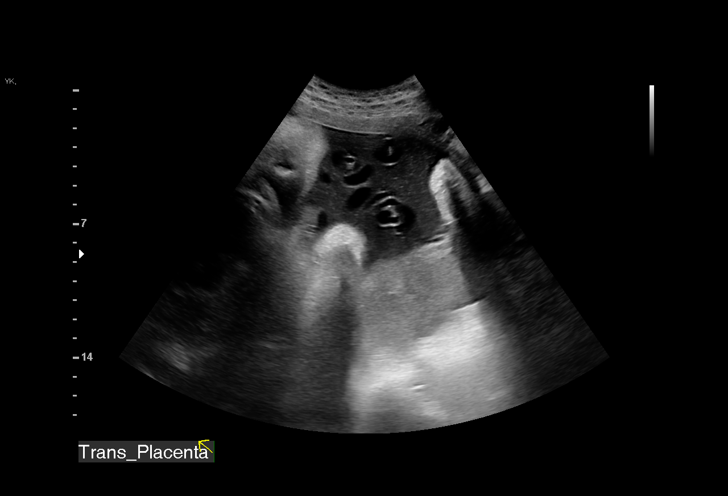
[im 10/39]
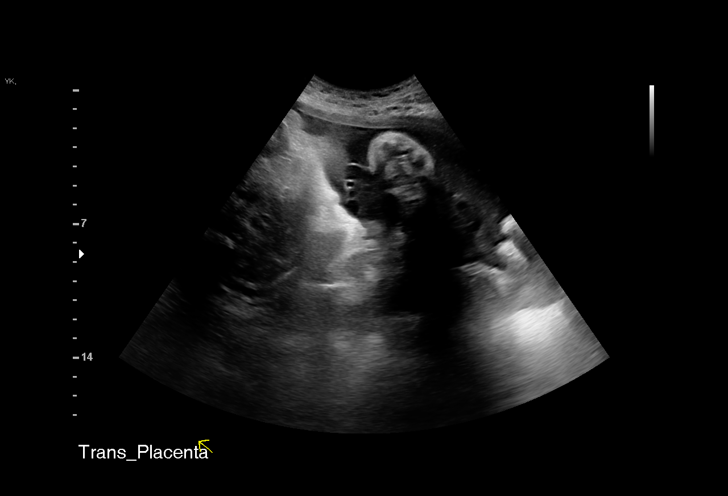
[im 13/39]
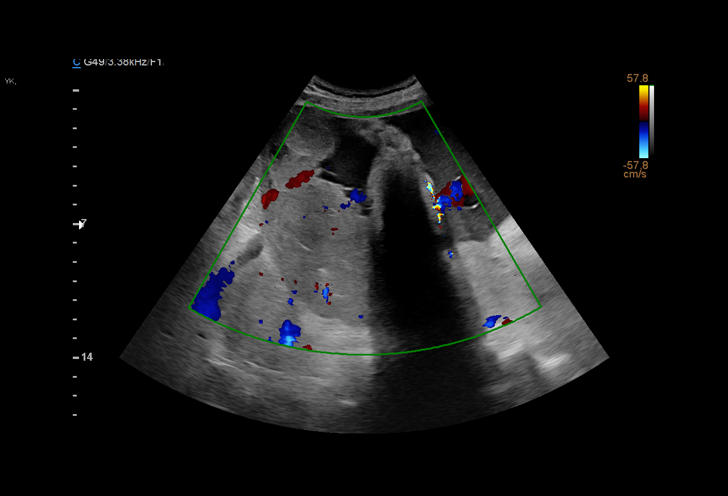
[im 16/39]
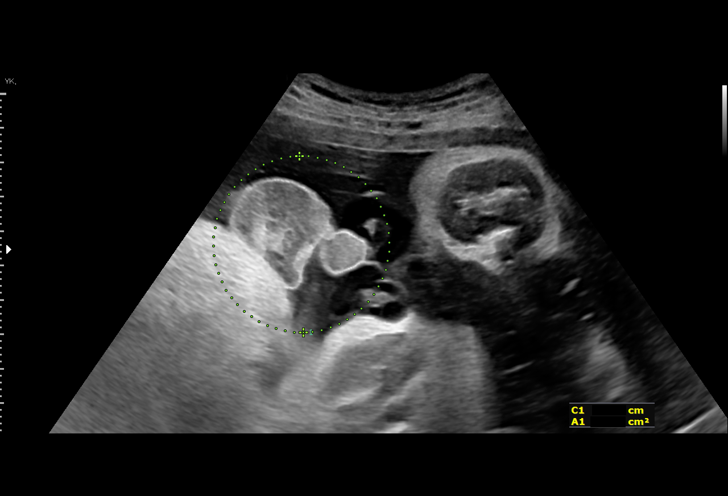
[im 19/39]
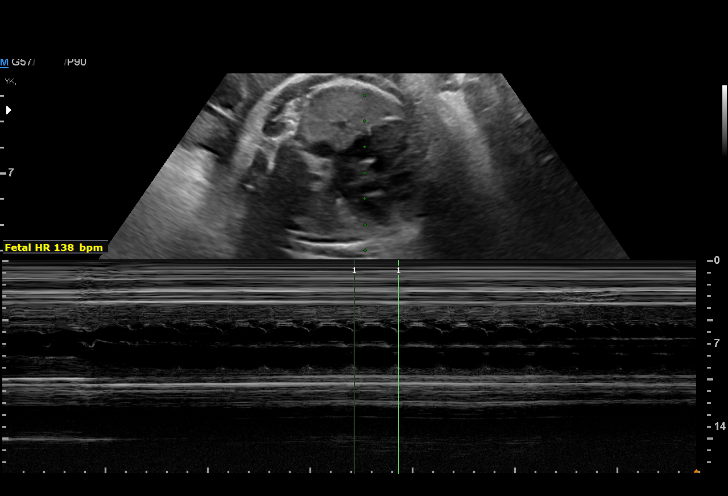
[im 22/39]
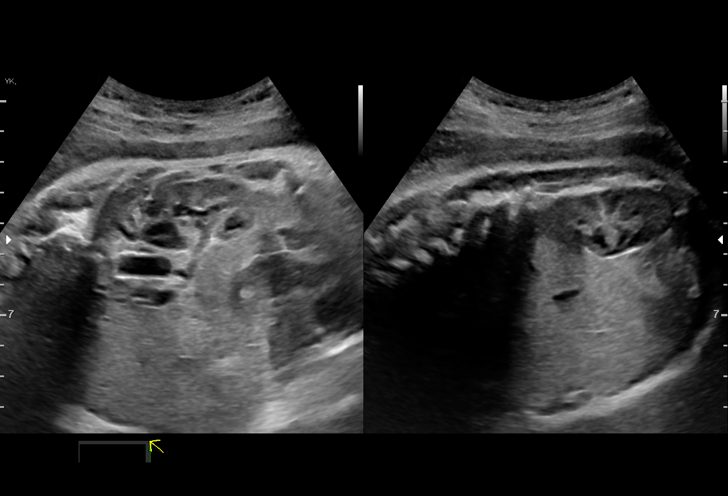
[im 24/39]
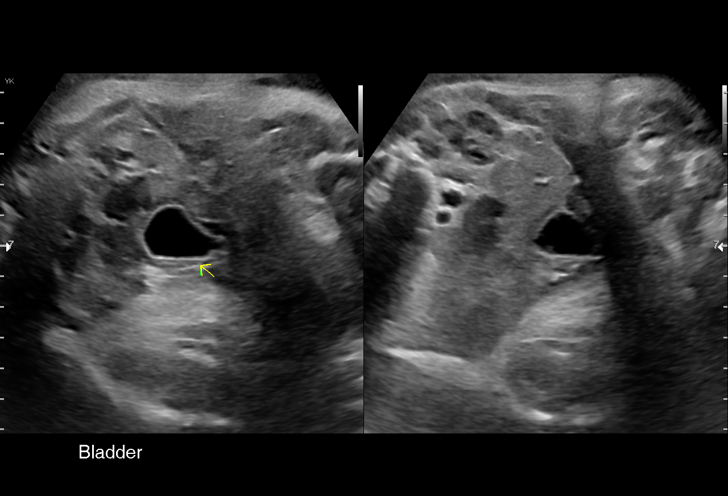
[im 27/39]
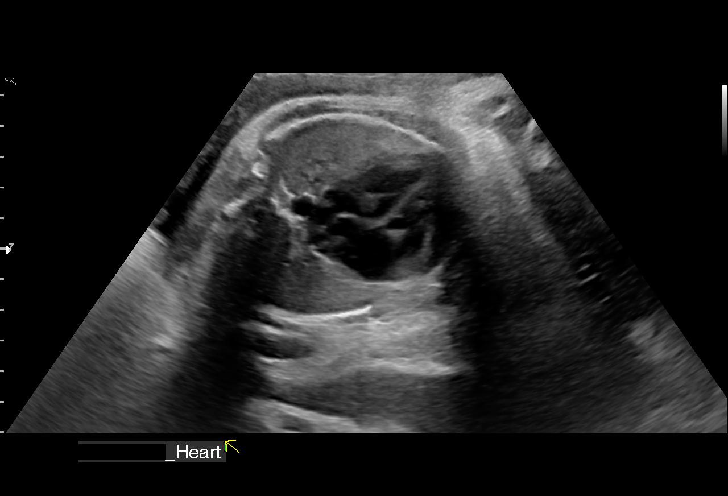
[im 30/39]
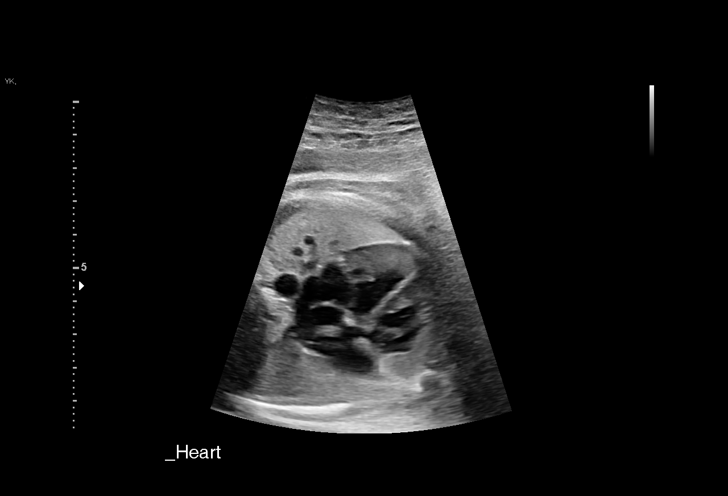
[im 33/39]
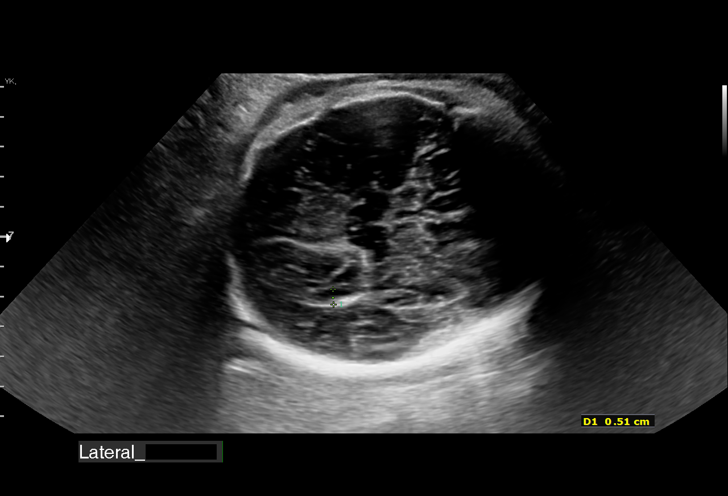
[im 36/39]
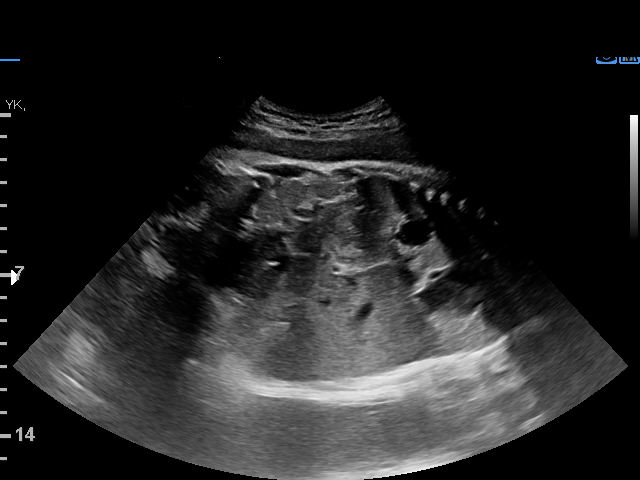
[im 39/39]
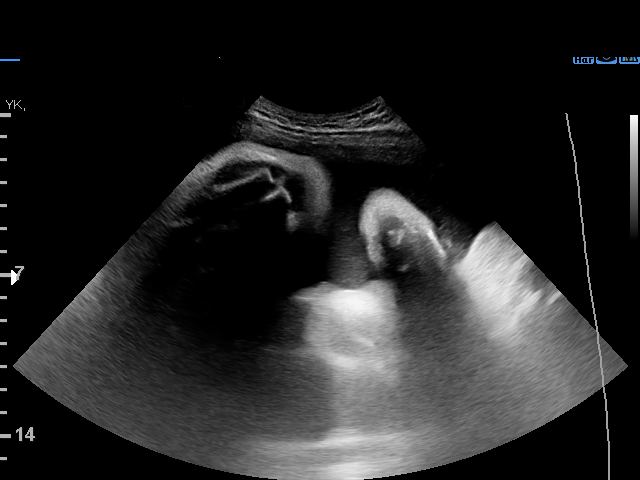

[14 of 28 positions shown; findings below may reference images not displayed]

Performed By:     Harmeet Aldrich        Secondary Phy.:    [facility]
                                                             [facility]
                                                             [facility] [HOSP[facility]]
                   MAU/Triage                                [HOSPITAL]

  1  US MFM OB LIMITED                    [id number]     GIORGI JUMPER
 ----------------------------------------------------------------------

 ----------------------------------------------------------------------
Indications

  Vaginal bleeding in pregnancy, third trimester
  38 weeks gestation of pregnancy

 ----------------------------------------------------------------------
Vital Signs

                           Pulse:  107
 BP:          138/85
Fetal Evaluation

 Num Of Fetuses:          1
 Fetal Heart Rate(bpm):   138
 Cardiac Activity:        Observed
 Presentation:            Cephalic
 Placenta:                Posterior Fundal
 P. Cord Insertion:       Not well visualized

 Amniotic Fluid
 AFI FV:      Within normal limits

 AFI Sum(cm)     %Tile       Largest Pocket(cm)
 11.03           34

 RUQ(cm)       RLQ(cm)       LUQ(cm)        LLQ(cm)


 Comment:    No placental abruption or previa identified.
OB History

 Gravidity:    1
Gestational Age

 LMP:           38w 2d        Date:  02/08/19                 EDD:   11/15/19
 Best:          38w 2d     Det. By:  LMP  (02/08/19)          EDD:   11/15/19
Anatomy

 Cranium:               Appears normal         Cord Vessels:           Appears normal (3
                                                                       vessel cord)
 Ventricles:            Appears normal         Kidneys:                Appear normal
 Heart:                 Appears normal         Bladder:                Appears normal
                        (4CH, axis, and
                        situs)
 Stomach:               Appears normal, left
                        sided

 Other:  Male gender. Technicallly difficult due to advanced GA and maternal
         habitus.
Cervix Uterus Adnexa

 Cervix
 Not visualized (advanced GA >77wks)
Impression

 Patient was admitted with vaginal bleeding.
 A limited ultrasound study was performed. Amniotic fluid is
 normal and good fetal activity is seen. Placenta appears
 normal with no obvious evidence of abruption. Cephalic
 presentation.
                 [place], [place]
